# Patient Record
Sex: Female | Born: 1990 | Race: White | Hispanic: No | Marital: Married | Smoking: Never smoker
Health system: Southern US, Community
[De-identification: ages and names within clinical notes are randomized; demographics above are authoritative.]

## PROBLEM LIST (undated history)

## (undated) DIAGNOSIS — G43909 Migraine, unspecified, not intractable, without status migrainosus: Secondary | ICD-10-CM

---

## 2017-10-03 ENCOUNTER — Emergency Department (HOSPITAL_COMMUNITY)
Admission: EM | Admit: 2017-10-03 | Discharge: 2017-10-03 | Disposition: A | Discharge status: Home / Self Care | Payer: BC Managed Care – PPO | Attending: Emergency Medicine | Admitting: Emergency Medicine

## 2017-10-03 ENCOUNTER — Encounter (HOSPITAL_COMMUNITY): Payer: Self-pay | Admitting: Emergency Medicine

## 2017-10-03 DIAGNOSIS — T148XXA Other injury of unspecified body region, initial encounter: Secondary | ICD-10-CM

## 2017-10-03 DIAGNOSIS — X58XXXA Exposure to other specified factors, initial encounter: Secondary | ICD-10-CM | POA: Diagnosis not present

## 2017-10-03 DIAGNOSIS — S7011XA Contusion of right thigh, initial encounter: Secondary | ICD-10-CM | POA: Diagnosis not present

## 2017-10-03 DIAGNOSIS — Y999 Unspecified external cause status: Secondary | ICD-10-CM | POA: Insufficient documentation

## 2017-10-03 DIAGNOSIS — Y929 Unspecified place or not applicable: Secondary | ICD-10-CM | POA: Diagnosis not present

## 2017-10-03 DIAGNOSIS — S7012XA Contusion of left thigh, initial encounter: Secondary | ICD-10-CM | POA: Diagnosis not present

## 2017-10-03 DIAGNOSIS — Y939 Activity, unspecified: Secondary | ICD-10-CM | POA: Diagnosis not present

## 2017-10-03 LAB — CBC WITH DIFFERENTIAL/PLATELET
BASOS PCT: 0 %
Basophils Absolute: 0 10*3/uL (ref 0.0–0.1)
EOS ABS: 0.1 10*3/uL (ref 0.0–0.7)
Eosinophils Relative: 1 %
HEMATOCRIT: 41.1 % (ref 36.0–46.0)
HEMOGLOBIN: 13.8 g/dL (ref 12.0–15.0)
LYMPHS ABS: 2.5 10*3/uL (ref 0.7–4.0)
Lymphocytes Relative: 29 %
MCH: 31.4 pg (ref 26.0–34.0)
MCHC: 33.6 g/dL (ref 30.0–36.0)
MCV: 93.6 fL (ref 78.0–100.0)
MONO ABS: 0.7 10*3/uL (ref 0.1–1.0)
MONOS PCT: 8 %
NEUTROS PCT: 62 %
Neutro Abs: 5.3 10*3/uL (ref 1.7–7.7)
Platelets: 240 10*3/uL (ref 150–400)
RBC: 4.39 MIL/uL (ref 3.87–5.11)
RDW: 12.2 % (ref 11.5–15.5)
WBC: 8.6 10*3/uL (ref 4.0–10.5)

## 2017-10-03 LAB — COMPREHENSIVE METABOLIC PANEL
ALBUMIN: 3.6 g/dL (ref 3.5–5.0)
ALK PHOS: 74 U/L (ref 38–126)
ALT: 14 U/L (ref 14–54)
ANION GAP: 7 (ref 5–15)
AST: 16 U/L (ref 15–41)
BILIRUBIN TOTAL: 0.7 mg/dL (ref 0.3–1.2)
BUN: 12 mg/dL (ref 6–20)
CALCIUM: 9.2 mg/dL (ref 8.9–10.3)
CO2: 24 mmol/L (ref 22–32)
Chloride: 107 mmol/L (ref 101–111)
Creatinine, Ser: 0.72 mg/dL (ref 0.44–1.00)
GFR calc non Af Amer: >60 mL/min (ref >60)
Glucose, Bld: 83 mg/dL (ref 65–99)
POTASSIUM: 3.8 mmol/L (ref 3.5–5.1)
SODIUM: 138 mmol/L (ref 135–145)
TOTAL PROTEIN: 6.5 g/dL (ref 6.5–8.1)

## 2017-10-03 NOTE — ED Notes (Signed)
Pt verbalizes understanding of d/c instructions. Pt ambulatory at d/c with all belongings.   

## 2017-10-03 NOTE — ED Triage Notes (Signed)
Reports noting bruising to both legs that started around Tuesday.  Unsure of any injury.  Also endorses swelling in legs from bruising.

## 2017-10-03 NOTE — Discharge Instructions (Signed)
See your Physician for recheck next week  °

## 2017-10-04 NOTE — ED Provider Notes (Signed)
[facility] EMERGENCY [facility]PARTMENT Provider Note   CSN: [id number] Arrival date & time: 10/03/17  1845     History   Chief Complaint Chief Complaint  Patient presents with  . Bleeding/Bruising    HPI Lisa Pugh is a 27 y.o. female.  Pt complains of bruising to both outer thighs.  Pt reports no injuries.  Pt reports she noticed bruising around legs on Tuesday.  Pt reports bruising has continued to get worse.  Pt denies any new activity.  Pt has not been sick.  Pt does not have bruising on head, neck or trunk.     The history is provided by the patient. No language interpreter was used.    History reviewed. No pertinent past medical history.  There are no active problems to display for this patient.   History reviewed. No pertinent surgical history.  OB History    No data available       Home Medications    Prior to Admission medications   Not on File    Family History No family history on file.  Social History Social History   Tobacco Use  . Smoking status: Never Smoker  . Smokeless tobacco: Never Used  Substance Use Topics  . Alcohol use: Yes    Comment: rarely  . Drug use: No     Allergies   Patient has no allergy information on record.   Review of Systems Review of Systems  Constitutional: Positive for chills and fatigue. Negative for fever.  HENT: Negative for nosebleeds.   Respiratory: Negative for cough.   Neurological: Negative for light-headedness.  Hematological: Bruises/bleeds easily.  All other systems reviewed and are negative.    Physical Exam Updated Vital Signs BP 107/62 (BP Location: Left Arm)   Pulse 70   Temp 98.3 F (36.8 C) (Oral)   Resp 18   Ht 5\' 8"  (1.727 m)   Wt 77.1 kg (170 lb)   SpO2 100%   BMI 25.85 kg/m   Physical Exam  Constitutional: She appears well-developed and well-nourished. No distress.  HENT:  Head: Normocephalic and atraumatic.  Eyes: Conjunctivae are normal.    Cardiovascular: Normal rate.  No murmur heard. Pulmonary/Chest: Effort normal. No respiratory distress.  Abdominal: There is no tenderness.  Musculoskeletal: She exhibits no edema.  Neurological: She is alert.  Skin: Skin is warm and dry.  Large bruises bilat outer thighs,  Abdomen and back spared,    Psychiatric: She has a normal mood and affect.  Nursing note and vitals reviewed.    ED Treatments / Results  Labs (all labs ordered are listed, but only abnormal results are displayed) Labs Reviewed  CBC WITH DIFFERENTIAL/PLATELET  COMPREHENSIVE METABOLIC PANEL    EKG  EKG Interpretation None       Radiology No results found.  Procedures Procedures (including critical care time)  Medications Ordered in ED Medications - No data to display   Initial Impression / Assessment and Plan / ED Course  I have reviewed the triage vital signs and the nursing notes.  Pertinent labs & imaging results that were available during my care of the patient were reviewed by me and considered in my medical decision making (see chart for details).     MDM  Cbc normal platelets normal,  Renal function and liver functions normal.  Pt advised to follow up with primary care,  One area looks like a varicosity.  Pt advised to monitor for anything that could be causing bruising.  Final Clinical Impressions(s) / ED Diagnoses   Final diagnoses:  Bruising    ED Discharge Orders    None    An After Visit Summary was printed and given to the patient.    Elson Areas, PA-C 10/04/17 0010    Charlynne Pander, MD 10/06/17 [phone]

## 2018-08-05 ENCOUNTER — Emergency Department (HOSPITAL_COMMUNITY)
Admission: EM | Admit: 2018-08-05 | Discharge: 2018-08-05 | Disposition: A | Discharge status: Home / Self Care | Payer: BLUE CROSS/BLUE SHIELD | Attending: Emergency Medicine | Admitting: Emergency Medicine

## 2018-08-05 ENCOUNTER — Encounter: Payer: Self-pay | Admitting: Emergency Medicine

## 2018-08-05 DIAGNOSIS — G43909 Migraine, unspecified, not intractable, without status migrainosus: Secondary | ICD-10-CM

## 2018-08-05 HISTORY — DX: Migraine, unspecified, not intractable, without status migrainosus: G43.909

## 2018-08-05 MED ORDER — PROCHLORPERAZINE EDISYLATE 10 MG/2ML IJ SOLN
10.0000 mg | Freq: Once | INTRAMUSCULAR | Status: AC
  Administered 2018-08-05: 10 mg via INTRAVENOUS
  Filled 2018-08-05: qty 2

## 2018-08-05 MED ORDER — DIPHENHYDRAMINE HCL 50 MG/ML IJ SOLN
25.0000 mg | Freq: Once | INTRAMUSCULAR | Status: AC
  Administered 2018-08-05: 25 mg via INTRAVENOUS
  Filled 2018-08-05: qty 1

## 2018-08-05 MED ORDER — KETOROLAC TROMETHAMINE 15 MG/ML IJ SOLN
15.0000 mg | Freq: Once | INTRAMUSCULAR | Status: AC
  Administered 2018-08-05: 15 mg via INTRAVENOUS
  Filled 2018-08-05: qty 1

## 2018-08-05 MED ORDER — ONDANSETRON HCL 4 MG/2ML IJ SOLN
4.0000 mg | Freq: Once | INTRAMUSCULAR | Status: AC
  Administered 2018-08-05: 4 mg via INTRAVENOUS
  Filled 2018-08-05: qty 2

## 2018-08-05 MED ORDER — SODIUM CHLORIDE 0.9 % IV BOLUS
1000.0000 mL | Freq: Once | INTRAVENOUS | Status: AC
  Administered 2018-08-05: 1000 mL via INTRAVENOUS

## 2018-08-05 NOTE — Discharge Instructions (Addendum)
You were evaluated in the Emergency Department and after careful evaluation, we did not find any emergent condition requiring admission or further testing in the hospital.  Your symptoms today seem to be due to a migraine.  We were able to make you feel better after migraine cocktail here in the emergency department.  You seemed to experience a side effect to Compazine today.  It would benefit you to mention this to other doctors in the future.  You can use Tylenol or ibuprofen at home for pain, continue to rest today, follow-up with your normal migraine doctor for further management.  Please return to the Emergency Department if you experience any worsening of your condition.  We encourage you to follow up with a primary care provider.  Thank you for allowing [place] to be a part of your care.

## 2018-08-05 NOTE — ED Triage Notes (Signed)
Pt in c/o migraine since Sunday, history of same, takes botox injections for treatment, unable to control with home medications

## 2018-08-05 NOTE — ED Notes (Signed)
Patient verbalizes understanding of discharge instructions. Opportunity for questioning and answers were provided. Armband removed by staff, pt discharged from ED.  

## 2018-08-05 NOTE — ED Provider Notes (Signed)
[facility] Emergency Department Provider Note MRN:  [id number]  Arrival date & time: 08/05/18     Chief Complaint   Migraine   History of Present Illness   Lisa Pugh is a 28 y.o. year-old female with a history of migraine presenting to the ED with chief complaint of migraine.  Headache began 3 days ago, gradual onset, located behind the eyes bilaterally as well as the occipital region.  Slowly escalated to severe, waxed and waned the next day, felt nearly back to normal upon awakening this morning, but then it gradually returned and is now severe again.  Denies fever, no neck pain.  Pain is worse with bright lights and loud noises, patient is experiencing left eye vision blurriness, which has happened in the past with her prior migraines.  Denies chest pain or shortness of breath, no abdominal pain, no numbness weakness to the arms or legs.  Associated with nausea.  Review of Systems  A complete 10 system review of systems was obtained and all systems are negative except as noted in the HPI and PMH.   Patient's Health History    Past Medical History:  Diagnosis Date  . Migraines     History reviewed. No pertinent surgical history.  History reviewed. No pertinent family history.  Social History   Socioeconomic History  . Marital status: Significant Other    Spouse name: Not on file  . Number of children: Not on file  . Years of education: Not on file  . Highest education level: Not on file  Occupational History  . Not on file  Social Needs  . Financial resource strain: Not on file  . Food insecurity:    Worry: Not on file    Inability: Not on file  . Transportation needs:    Medical: Not on file    Non-medical: Not on file  Tobacco Use  . Smoking status: Never Smoker  . Smokeless tobacco: Never Used  Substance and Sexual Activity  . Alcohol use: Yes    Comment: rarely  . Drug use: No  . Sexual activity: Not on file  Lifestyle  . Physical  activity:    Days per week: Not on file    Minutes per session: Not on file  . Stress: Not on file  Relationships  . Social connections:    Talks on phone: Not on file    Gets together: Not on file    Attends religious service: Not on file    Active member of club or organization: Not on file    Attends meetings of clubs or organizations: Not on file    Relationship status: Not on file  . Intimate partner violence:    Fear of current or ex partner: Not on file    Emotionally abused: Not on file    Physically abused: Not on file    Forced sexual activity: Not on file  Other Topics Concern  . Not on file  Social History Narrative  . Not on file     Physical Exam  Vital Signs and Nursing Notes reviewed Vitals:   08/05/18 1152 08/05/18 1249  BP: 123/80 114/67  Pulse: 75 79  Resp: 12 20  Temp: 97.7 F (36.5 C)   SpO2: 100% 95%    CONSTITUTIONAL: Well-appearing, NAD NEURO:  Alert and oriented x 3, no focal deficits EYES:  eyes equal and reactive, normal extraocular movements, subjective decreased visual acuity to the left eye in the lateral visual  field ENT/NECK:  no LAD, no JVD CARDIO: Regular rate, well-perfused, normal S1 and S2 PULM:  CTAB no wheezing or rhonchi GI/GU:  normal bowel sounds, non-distended, non-tender MSK/SPINE:  No gross deformities, no edema SKIN:  no rash, atraumatic PSYCH:  Appropriate speech and behavior  Diagnostic and Interventional Summary    EKG Interpretation  Date/Time:  Wednesday August 05 2018 12:49:52 EST Ventricular Rate:  77 PR Interval:  138 QRS Duration: 76 QT Interval:  398 QTC Calculation: 450 R Axis:   81 Text Interpretation:  Normal sinus rhythm Normal ECG Confirmed by Kennis CarinaBero, Jasdeep Kepner [phone]([id number]) on 08/05/2018 1:09:11 PM      Labs Reviewed - No data to display  No orders to display    Medications  sodium chloride 0.9 % bolus 1,000 mL (1,000 mLs Intravenous New Bag/Given 08/05/18 1215)  ondansetron (ZOFRAN) injection 4 mg (4 mg  Intravenous Given 08/05/18 1208)  ketorolac (TORADOL) 15 MG/ML injection 15 mg (15 mg Intravenous Given 08/05/18 1210)  diphenhydrAMINE (BENADRYL) injection 25 mg (25 mg Intravenous Given 08/05/18 1215)  prochlorperazine (COMPAZINE) injection 10 mg (10 mg Intravenous Given 08/05/18 1211)  diphenhydrAMINE (BENADRYL) injection 25 mg (25 mg Intravenous Given 08/05/18 1247)     Procedures Critical Care  ED Course and Medical Decision Making  I have reviewed the triage vital signs and the nursing notes.  Pertinent labs & imaging results that were available during my care of the patient were reviewed by me and considered in my medical decision making (see below for details).   Concern for migraine in this 28 year old female with history of the same.  Would be considered complex given the subjective decreased visual acuity of the left eye, no other neurological deficits.  No meningismus.  Given that this has occurred in the past, no indication for CNS imaging at this moment.  Little to no concern for subarachnoid hemorrhage given the gradual onset and patient's history.  Will provide migraine cocktail and reassess.  Clinical Course as of Aug 05 1312  Wed Aug 05, 2018  1202 Patient explains that there is no chance that she is pregnant and defers pregnancy testing today.  Just started her period.   [MB]  1244 15 minutes after migraine cocktail patient experiencing jitteriness, appears anxious, total body unpleasant sensation, endorsing trouble breathing, denies chest pain.  Seems consistent with reaction to Compazine, will provide more Benadryl.  Lungs clear, will obtain repeat set of vital signs, EKG, reassess.   [MB]    Clinical Course User Index [MB] Sabas SousBero, Wendy Mikles M, MD    EKG unremarkable, patient's symptoms completely resolved after a few minutes and with additional dose of IV Benadryl.  Patient is now resting comfortably, headache largely resolved.  Reevaluation demonstrates resolution of her visual  disturbance, now completely normal.  All consistent with migraine, will follow-up with her normal migraine specialist.  After the discussed management above, the patient was determined to be safe for discharge.  The patient was in agreement with this plan and all questions regarding their care were answered.  ED return precautions were discussed and the patient will return to the ED with any significant worsening of condition.  Elmer SowMichael M. Pilar PlateBero, MD [facility] [email or web address]  Final Clinical Impressions(s) / ED Diagnoses     ICD-10-CM   1. Migraine without status migrainosus, not intractable, unspecified migraine type G43.909     ED Discharge Orders    None         Kennis CarinaBero, Virga Haltiwanger  M, MD 08/05/18 1315

## 2019-07-27 ENCOUNTER — Ambulatory Visit: Payer: BC Managed Care – PPO | Attending: Internal Medicine

## 2019-10-11 ENCOUNTER — Emergency Department (HOSPITAL_BASED_OUTPATIENT_CLINIC_OR_DEPARTMENT_OTHER)
Admission: EM | Admit: 2019-10-11 | Discharge: 2019-10-11 | Disposition: A | Discharge status: Home / Self Care | Payer: BC Managed Care – PPO | Attending: Emergency Medicine | Admitting: Emergency Medicine

## 2019-10-11 ENCOUNTER — Encounter (HOSPITAL_BASED_OUTPATIENT_CLINIC_OR_DEPARTMENT_OTHER): Payer: Self-pay | Admitting: *Deleted

## 2019-10-11 ENCOUNTER — Other Ambulatory Visit: Payer: Self-pay

## 2019-10-11 ENCOUNTER — Emergency Department (HOSPITAL_BASED_OUTPATIENT_CLINIC_OR_DEPARTMENT_OTHER): Payer: BC Managed Care – PPO

## 2019-10-11 DIAGNOSIS — Y929 Unspecified place or not applicable: Secondary | ICD-10-CM | POA: Insufficient documentation

## 2019-10-11 DIAGNOSIS — Y999 Unspecified external cause status: Secondary | ICD-10-CM | POA: Insufficient documentation

## 2019-10-11 DIAGNOSIS — W08XXXA Fall from other furniture, initial encounter: Secondary | ICD-10-CM | POA: Insufficient documentation

## 2019-10-11 DIAGNOSIS — S060X0A Concussion without loss of consciousness, initial encounter: Secondary | ICD-10-CM | POA: Insufficient documentation

## 2019-10-11 DIAGNOSIS — Y9341 Activity, dancing: Secondary | ICD-10-CM | POA: Insufficient documentation

## 2019-10-11 DIAGNOSIS — S0990XA Unspecified injury of head, initial encounter: Secondary | ICD-10-CM | POA: Diagnosis present

## 2019-10-11 MED ORDER — ONDANSETRON HCL 4 MG/2ML IJ SOLN
4.0000 mg | Freq: Once | INTRAMUSCULAR | Status: AC
  Administered 2019-10-11: 21:00:00 4 mg via INTRAVENOUS
  Filled 2019-10-11: qty 2

## 2019-10-11 MED ORDER — KETOROLAC TROMETHAMINE 15 MG/ML IJ SOLN
15.0000 mg | Freq: Once | INTRAMUSCULAR | Status: AC
  Administered 2019-10-11: 21:00:00 15 mg via INTRAVENOUS
  Filled 2019-10-11: qty 1

## 2019-10-11 MED ORDER — ONDANSETRON 4 MG PO TBDP: 4.0000 mg | ORAL_TABLET | Freq: Three times a day (TID) | ORAL | 0 refills | Status: DC | PRN

## 2019-10-11 MED ORDER — SODIUM CHLORIDE 0.9 % IV BOLUS
1000.0000 mL | Freq: Once | INTRAVENOUS | Status: AC
  Administered 2019-10-11: 21:00:00 1000 mL via INTRAVENOUS

## 2019-10-11 NOTE — Discharge Instructions (Signed)
Tomorrow morning, please call your neurology office to get a close follow-up appointment for either tomorrow or on Wednesday.  Recommend taking the Zofran as needed for nausea.  Recommend taking Motrin or naproxen as well as Tylenol for pain control.  If you develop worsening vomiting, severe headache, vision changes, passing out or other new concerning symptom, return to ER for reassessment.

## 2019-10-11 NOTE — ED Triage Notes (Signed)
Pt reports that she was in [place] this past weekend. States that she fell while at a bar, hitting her posterior head. Reports that she flew home yesterday and is dizzy, nauseated and does not remember flying home. Pt reports posterior neck pain, c-collar applied in triage. ambulatory

## 2019-10-11 NOTE — ED Provider Notes (Signed)
[facility] EMERGENCY DEPARTMENT Provider Note   CSN: [id number] Arrival date & time: 10/11/19  1934     History Chief Complaint  Patient presents with  . Fall    Lisa Pugh is a 29 y.o. female.  Presents emergency department for follow-up after a fall.  Patient was on gross week in [place], dancing on top of a table while intoxicated Friday when she fell, landing on her head.  Initially thought she was okay however the past couple days has been having severe headache, intermittent nausea.  Headache up to 10 out of 10 in severity.  Dull, achy.  Throbbing.  Has had difficulty recalling recent events, short-term memory.  Pain neck dull, achy.  No associated numbness or weakness in her arms or legs.  Denies other associated injuries.  HPI     Past Medical History:  Diagnosis Date  . Migraines     There are no problems to display for this patient.   History reviewed. No pertinent surgical history.   OB History   No obstetric history on file.     History reviewed. No pertinent family history.  Social History   Tobacco Use  . Smoking status: Never Smoker  . Smokeless tobacco: Never Used  Substance Use Topics  . Alcohol use: Yes    Comment: rarely  . Drug use: No    Home Medications Prior to Admission medications   Medication Sig Start Date End Date Taking? Authorizing Provider  ondansetron (ZOFRAN ODT) 4 MG disintegrating tablet Take 1 tablet (4 mg total) by mouth every 8 (eight) hours as needed for nausea or vomiting. 10/11/19   Milagros Loll, MD    Allergies    Compazine [prochlorperazine]  Review of Systems   Review of Systems  Constitutional: Negative for chills and fever.  HENT: Negative for ear pain and sore throat.   Eyes: Negative for pain and visual disturbance.  Respiratory: Negative for cough and shortness of breath.   Cardiovascular: Negative for chest pain and palpitations.  Gastrointestinal: Positive for nausea. Negative for  abdominal pain and vomiting.  Genitourinary: Negative for dysuria and hematuria.  Musculoskeletal: Negative for arthralgias and back pain.  Skin: Negative for color change and rash.  Neurological: Positive for headaches. Negative for seizures and syncope.  All other systems reviewed and are negative.   Physical Exam Updated Vital Signs BP (!) 97/58 (BP Location: Left Arm)   Pulse 68   Temp (!) 97.2 F (36.2 C)   Resp 16   Ht 5\' 8"  (1.727 m)   Wt 72.6 kg   LMP 10/08/2019   SpO2 99%   BMI 24.33 kg/m   Physical Exam Vitals and nursing note reviewed.  Constitutional:      General: She is not in acute distress.    Appearance: She is well-developed.  HENT:     Head: Normocephalic and atraumatic.  Eyes:     Conjunctiva/sclera: Conjunctivae normal.  Neck:     Comments: No focal bony tenderness Cardiovascular:     Rate and Rhythm: Normal rate and regular rhythm.     Heart sounds: No murmur.  Pulmonary:     Effort: Pulmonary effort is normal. No respiratory distress.     Breath sounds: Normal breath sounds.  Abdominal:     Palpations: Abdomen is soft.     Tenderness: There is no abdominal tenderness.  Musculoskeletal:     Cervical back: Normal range of motion and neck supple. No rigidity.  Skin:  General: Skin is warm and dry.     Capillary Refill: Capillary refill takes less than 2 seconds.  Neurological:     General: No focal deficit present.     Mental Status: She is alert and oriented to person, place, and time.     Motor: No weakness.     Coordination: Coordination normal.     Gait: Gait normal.  Psychiatric:        Mood and Affect: Mood normal.     ED Results / Procedures / Treatments   Labs (all labs ordered are listed, but only abnormal results are displayed) Labs Reviewed - No data to display  EKG None  Radiology CT Head Wo Contrast  Result Date: 10/11/2019 CLINICAL DATA:  Head trauma headache EXAM: CT HEAD WITHOUT CONTRAST TECHNIQUE: Contiguous  axial images were obtained from the base of the skull through the vertex without intravenous contrast. COMPARISON:  None. FINDINGS: Brain: No acute territorial infarction, hemorrhage or intracranial mass. The ventricles are nonenlarged. Vascular: No hyperdense vessel or unexpected calcification. Skull: Normal. Negative for fracture or focal lesion. Sinuses/Orbits: No acute finding. Other: None IMPRESSION: Negative non contrasted CT appearance of the brain Electronically Signed   By: Jasmine Pang M.D.   On: 10/11/2019 20:14   CT Cervical Spine Wo Contrast  Result Date: 10/11/2019 CLINICAL DATA:  Head trauma headache EXAM: CT CERVICAL SPINE WITHOUT CONTRAST TECHNIQUE: Multidetector CT imaging of the cervical spine was performed without intravenous contrast. Multiplanar CT image reconstructions were also generated. COMPARISON:  None. FINDINGS: Alignment: Normal. Skull base and vertebrae: No acute fracture. No primary bone lesion or focal pathologic process. Soft tissues and spinal canal: No prevertebral fluid or swelling. No visible canal hematoma. Disc levels:  Within normal limits Upper chest: Negative. Other: None IMPRESSION: Negative.  No CT evidence for acute osseous abnormality. Electronically Signed   By: Jasmine Pang M.D.   On: 10/11/2019 20:16    Procedures Procedures (including critical care time)  Medications Ordered in ED Medications  ondansetron (ZOFRAN) injection 4 mg (4 mg Intravenous Given 10/11/19 2120)  ketorolac (TORADOL) 15 MG/ML injection 15 mg (15 mg Intravenous Given 10/11/19 2120)  sodium chloride 0.9 % bolus 1,000 mL (0 mLs Intravenous Stopped 10/11/19 2226)    ED Course  I have reviewed the triage vital signs and the nursing notes.  Pertinent labs & imaging results that were available during my care of the patient were reviewed by me and considered in my medical decision making (see chart for details).    MDM Rules/Calculators/A&P                      29 year old lady  presenting to ER with severe headache, nausea and neck pain in setting of recent fall and head injury.  CT head and C-spine were negative.  Suspect patient suffered concussion.  Provided with headache cocktail, significant improvement in her symptoms.  Established patient with [facility] neurology for migraines.  For follow-up on this suspected concussion, recommended that she call her neurologist tomorrow morning to get close follow-up appointment to be seen tomorrow or on Wednesday.  Patient agreeable.  Will discharge home.    After the discussed management above, the patient was determined to be safe for discharge.  The patient was in agreement with this plan and all questions regarding their care were answered.  ED return precautions were discussed and the patient will return to the ED with any significant worsening of condition.   Final  Clinical Impression(s) / ED Diagnoses Final diagnoses:  Concussion without loss of consciousness, initial encounter    Rx / DC Orders ED Discharge Orders         Ordered    ondansetron (ZOFRAN ODT) 4 MG disintegrating tablet  Every 8 hours PRN     10/11/19 2216           Lucrezia Starch, MD 10/12/19 0010

## 2019-11-16 ENCOUNTER — Emergency Department (HOSPITAL_BASED_OUTPATIENT_CLINIC_OR_DEPARTMENT_OTHER)
Admission: EM | Admit: 2019-11-16 | Discharge: 2019-11-16 | Disposition: A | Discharge status: Home / Self Care | Payer: BC Managed Care – PPO | Attending: Emergency Medicine | Admitting: Emergency Medicine

## 2019-11-16 ENCOUNTER — Other Ambulatory Visit: Payer: Self-pay

## 2019-11-16 ENCOUNTER — Encounter (HOSPITAL_BASED_OUTPATIENT_CLINIC_OR_DEPARTMENT_OTHER): Payer: Self-pay | Admitting: *Deleted

## 2019-11-16 DIAGNOSIS — E876 Hypokalemia: Secondary | ICD-10-CM | POA: Diagnosis not present

## 2019-11-16 DIAGNOSIS — R197 Diarrhea, unspecified: Secondary | ICD-10-CM | POA: Insufficient documentation

## 2019-11-16 DIAGNOSIS — Z888 Allergy status to other drugs, medicaments and biological substances status: Secondary | ICD-10-CM | POA: Insufficient documentation

## 2019-11-16 DIAGNOSIS — R112 Nausea with vomiting, unspecified: Secondary | ICD-10-CM | POA: Diagnosis present

## 2019-11-16 LAB — URINALYSIS, ROUTINE W REFLEX MICROSCOPIC
Bilirubin Urine: NEGATIVE
Glucose, UA: NEGATIVE mg/dL
Hgb urine dipstick: NEGATIVE
Ketones, ur: NEGATIVE mg/dL
Leukocytes,Ua: NEGATIVE
Nitrite: NEGATIVE
Protein, ur: NEGATIVE mg/dL
Specific Gravity, Urine: 1.015 (ref 1.005–1.030)
pH: 6 (ref 5.0–8.0)

## 2019-11-16 LAB — COMPREHENSIVE METABOLIC PANEL
ALT: 16 U/L (ref 0–44)
AST: 18 U/L (ref 15–41)
Albumin: 3.7 g/dL (ref 3.5–5.0)
Alkaline Phosphatase: 75 U/L (ref 38–126)
Anion gap: 11 (ref 5–15)
BUN: 12 mg/dL (ref 6–20)
CO2: 25 mmol/L (ref 22–32)
Calcium: 9.2 mg/dL (ref 8.9–10.3)
Chloride: 101 mmol/L (ref 98–111)
Creatinine, Ser: 0.94 mg/dL (ref 0.44–1.00)
GFR calc Af Amer: >60 mL/min (ref >60)
GFR calc non Af Amer: >60 mL/min (ref >60)
Glucose, Bld: 97 mg/dL (ref 70–99)
Potassium: 2.9 mmol/L — ABNORMAL LOW (ref 3.5–5.1)
Sodium: 137 mmol/L (ref 135–145)
Total Bilirubin: 0.5 mg/dL (ref 0.3–1.2)
Total Protein: 7.3 g/dL (ref 6.5–8.1)

## 2019-11-16 LAB — CBC
HCT: 48.2 % — ABNORMAL HIGH (ref 36.0–46.0)
Hemoglobin: 16.8 g/dL — ABNORMAL HIGH (ref 12.0–15.0)
MCH: 32.7 pg (ref 26.0–34.0)
MCHC: 34.9 g/dL (ref 30.0–36.0)
MCV: 94 fL (ref 80.0–100.0)
Platelets: 234 10*3/uL (ref 150–400)
RBC: 5.13 MIL/uL — ABNORMAL HIGH (ref 3.87–5.11)
RDW: 12 % (ref 11.5–15.5)
WBC: 6.9 10*3/uL (ref 4.0–10.5)
nRBC: 0 % (ref 0.0–0.2)

## 2019-11-16 LAB — LIPASE, BLOOD: Lipase: 29 U/L (ref 11–51)

## 2019-11-16 LAB — PREGNANCY, URINE: Preg Test, Ur: NEGATIVE

## 2019-11-16 MED ORDER — ONDANSETRON 4 MG PO TBDP: 4.0000 mg | ORAL_TABLET | Freq: Three times a day (TID) | ORAL | 0 refills | Status: AC | PRN

## 2019-11-16 MED ORDER — POTASSIUM CHLORIDE CRYS ER 20 MEQ PO TBCR
40.0000 meq | EXTENDED_RELEASE_TABLET | Freq: Once | ORAL | Status: AC
  Administered 2019-11-16: 40 meq via ORAL
  Filled 2019-11-16: qty 2

## 2019-11-16 MED ORDER — ONDANSETRON HCL 4 MG/2ML IJ SOLN
4.0000 mg | Freq: Once | INTRAMUSCULAR | Status: AC
  Administered 2019-11-16: 4 mg via INTRAVENOUS
  Filled 2019-11-16: qty 2

## 2019-11-16 MED ORDER — LACTATED RINGERS IV BOLUS
1000.0000 mL | Freq: Once | INTRAVENOUS | Status: AC
  Administered 2019-11-16: 1000 mL via INTRAVENOUS

## 2019-11-16 MED ORDER — LORAZEPAM 2 MG/ML IJ SOLN
1.0000 mg | Freq: Once | INTRAMUSCULAR | Status: AC
  Administered 2019-11-16: 1 mg via INTRAVENOUS
  Filled 2019-11-16: qty 1

## 2019-11-16 MED ORDER — SODIUM CHLORIDE 0.9% FLUSH
3.0000 mL | Freq: Once | INTRAVENOUS | Status: DC
  Filled 2019-11-16: qty 3

## 2019-11-16 MED ORDER — DROPERIDOL 2.5 MG/ML IJ SOLN
2.5000 mg | Freq: Once | INTRAMUSCULAR | Status: AC
  Administered 2019-11-16: 20:00:00 2.5 mg via INTRAVENOUS
  Filled 2019-11-16: qty 2

## 2019-11-16 NOTE — ED Provider Notes (Signed)
[facility] EMERGENCY DEPARTMENT Provider Note   CSN: [id number] Arrival date & time: 11/16/19  1701     History Chief Complaint  Patient presents with  . Emesis    Lisa Pugh is a 29 y.o. female.  29 year old female with history of migraines who presents with vomiting and diarrhea.  2 days ago, the patient began having significant vomiting associated with nonbloody diarrhea.  Her vomiting has improved, last episode was yesterday, but she continues to be nauseated.  No improvement with Phenergan and Dramamine at home.  She continues to have diarrhea.  She broke out in a sweat on the first night of her symptoms but did not measure her temperature.  She reports generalized crampy abdominal pain.  No cough, sore throat, runny nose, urinary symptoms, sick contacts, or recent travel.  The history is provided by the patient.  Emesis      Past Medical History:  Diagnosis Date  . Migraines     There are no problems to display for this patient.   History reviewed. No pertinent surgical history.   OB History   No obstetric history on file.     History reviewed. No pertinent family history.  Social History   Tobacco Use  . Smoking status: Never Smoker  . Smokeless tobacco: Never Used  Substance Use Topics  . Alcohol use: Yes    Comment: rarely  . Drug use: No    Home Medications Prior to Admission medications   Medication Sig Start Date End Date Taking? Authorizing Provider  ondansetron (ZOFRAN ODT) 4 MG disintegrating tablet Take 1 tablet (4 mg total) by mouth every 8 (eight) hours as needed for nausea or vomiting. 11/16/19   Shataya Winkles, Ambrose Finland, MD    Allergies    Compazine [prochlorperazine]  Review of Systems   Review of Systems  Gastrointestinal: Positive for vomiting.   All other systems reviewed and are negative except that which was mentioned in HPI  Physical Exam Updated Vital Signs BP 124/79   Pulse (!) 118   Temp 97.9 F (36.6 C)  (Oral)   Resp 17   Ht 5\' 8"  (1.727 m)   Wt 67.1 kg   LMP 11/02/2019   SpO2 100%   BMI 22.50 kg/m   Physical Exam Vitals and nursing note reviewed.  Constitutional:      General: She is not in acute distress.    Appearance: She is well-developed.  HENT:     Head: Normocephalic and atraumatic.  Eyes:     Conjunctiva/sclera: Conjunctivae normal.  Cardiovascular:     Rate and Rhythm: Normal rate and regular rhythm.     Heart sounds: Normal heart sounds. No murmur.  Pulmonary:     Effort: Pulmonary effort is normal.     Breath sounds: Normal breath sounds.  Abdominal:     General: Bowel sounds are normal. There is no distension.     Palpations: Abdomen is soft.     Tenderness: There is abdominal tenderness. There is no guarding or rebound.     Comments: Mild generalized abdominal tenderness without peritonitis  Musculoskeletal:     Cervical back: Neck supple.  Skin:    General: Skin is warm and dry.  Neurological:     Mental Status: She is alert and oriented to person, place, and time.     Comments: Fluent speech  Psychiatric:        Judgment: Judgment normal.     ED Results / Procedures / Treatments  Labs (all labs ordered are listed, but only abnormal results are displayed) Labs Reviewed  COMPREHENSIVE METABOLIC PANEL - Abnormal; Notable for the following components:      Result Value   Potassium 2.9 (*)    All other components within normal limits  CBC - Abnormal; Notable for the following components:   RBC 5.13 (*)    Hemoglobin 16.8 (*)    HCT 48.2 (*)    All other components within normal limits  LIPASE, BLOOD  URINALYSIS, ROUTINE W REFLEX MICROSCOPIC  PREGNANCY, URINE    EKG None  Radiology No results found.  Procedures Procedures (including critical care time)  Medications Ordered in ED Medications  lactated ringers bolus 1,000 mL (0 mLs Intravenous Stopped 11/16/19 2014)  ondansetron (ZOFRAN) injection 4 mg (4 mg Intravenous Given 11/16/19  1912)  potassium chloride SA (KLOR-CON) CR tablet 40 mEq (40 mEq Oral Given 11/16/19 1937)  droperidol (INAPSINE) 2.5 MG/ML injection 2.5 mg (2.5 mg Intravenous Given 11/16/19 2029)  LORazepam (ATIVAN) injection 1 mg (1 mg Intravenous Given 11/16/19 2055)    ED Course  I have reviewed the triage vital signs and the nursing notes.  Pertinent labs that were available during my care of the patient were reviewed by me and considered in my medical decision making (see chart for details).    MDM Rules/Calculators/A&P                      Nontoxic on exam, afebrile, reassuring vital signs.  Mild generalized abdominal tenderness and cramping.  Lab work shows reassuring CBC, lipase, and LFTs with normal creatinine.  Potassium is 2.9 which is not surprising given her symptoms.  Ordered IV fluid bolus, Zofran, and potassium. Later gave droperidol for nausea; pt became anxious and felt worse, gave ativan. On reassessment she was feeling better. No vomiting in the ED.  Discussed supportive measures including continued hydration with electrolyte-containing solution at home, Zofran as needed, and probiotics with slow advancement of diet.  Reviewed return precautions including any intractable vomiting or worsening abdominal pain.  She voiced understanding. Final Clinical Impression(s) / ED Diagnoses Final diagnoses:  Nausea vomiting and diarrhea  Hypokalemia    Rx / DC Orders ED Discharge Orders         Ordered    ondansetron (ZOFRAN ODT) 4 MG disintegrating tablet  Every 8 hours PRN     11/16/19 2152           Leeandra Ellerson, Wenda Overland, MD 11/16/19 [phone]

## 2019-11-16 NOTE — ED Triage Notes (Signed)
N/V/D x 2-3 days. Generalized abdominal pain and HA.

## 2019-11-16 NOTE — ED Notes (Signed)
Sprite and crackers given  

## 2019-11-16 NOTE — ED Notes (Signed)
ED Provider at bedside. 

## 2021-05-01 ENCOUNTER — Other Ambulatory Visit: Payer: Self-pay | Admitting: Neurology

## 2021-05-01 DIAGNOSIS — R519 Headache, unspecified: Secondary | ICD-10-CM

## 2021-05-03 ENCOUNTER — Other Ambulatory Visit: Payer: Self-pay

## 2021-05-03 ENCOUNTER — Ambulatory Visit
Admission: RE | Admit: 2021-05-03 | Discharge: 2021-05-03 | Disposition: A | Discharge status: Home / Self Care | Payer: BC Managed Care – PPO | Source: Ambulatory Visit | Attending: Neurology | Admitting: Neurology

## 2021-05-03 DIAGNOSIS — R519 Headache, unspecified: Secondary | ICD-10-CM

## 2021-11-18 ENCOUNTER — Other Ambulatory Visit: Payer: Self-pay

## 2021-11-18 ENCOUNTER — Encounter (HOSPITAL_BASED_OUTPATIENT_CLINIC_OR_DEPARTMENT_OTHER): Payer: Self-pay | Admitting: Emergency Medicine

## 2021-11-18 ENCOUNTER — Emergency Department (HOSPITAL_BASED_OUTPATIENT_CLINIC_OR_DEPARTMENT_OTHER)
Admission: EM | Admit: 2021-11-18 | Discharge: 2021-11-18 | Disposition: A | Discharge status: Home / Self Care | Payer: BC Managed Care – PPO | Attending: Emergency Medicine | Admitting: Emergency Medicine

## 2021-11-18 DIAGNOSIS — R519 Headache, unspecified: Secondary | ICD-10-CM | POA: Diagnosis present

## 2021-11-18 DIAGNOSIS — G43909 Migraine, unspecified, not intractable, without status migrainosus: Secondary | ICD-10-CM | POA: Insufficient documentation

## 2021-11-18 LAB — CBC WITH DIFFERENTIAL/PLATELET
Abs Immature Granulocytes: 0.03 10*3/uL (ref 0.00–0.07)
Basophils Absolute: 0 10*3/uL (ref 0.0–0.1)
Basophils Relative: 0 %
Eosinophils Absolute: 0.2 10*3/uL (ref 0.0–0.5)
Eosinophils Relative: 2 %
HCT: 47.5 % — ABNORMAL HIGH (ref 36.0–46.0)
Hemoglobin: 16.2 g/dL — ABNORMAL HIGH (ref 12.0–15.0)
Immature Granulocytes: 0 %
Lymphocytes Relative: 14 %
Lymphs Abs: 1.1 10*3/uL (ref 0.7–4.0)
MCH: 32.1 pg (ref 26.0–34.0)
MCHC: 34.1 g/dL (ref 30.0–36.0)
MCV: 94.1 fL (ref 80.0–100.0)
Monocytes Absolute: 0.6 10*3/uL (ref 0.1–1.0)
Monocytes Relative: 7 %
Neutro Abs: 5.9 10*3/uL (ref 1.7–7.7)
Neutrophils Relative %: 77 %
Platelets: 281 10*3/uL (ref 150–400)
RBC: 5.05 MIL/uL (ref 3.87–5.11)
RDW: 12.2 % (ref 11.5–15.5)
WBC: 7.8 10*3/uL (ref 4.0–10.5)
nRBC: 0 % (ref 0.0–0.2)

## 2021-11-18 LAB — BASIC METABOLIC PANEL
Anion gap: 8 (ref 5–15)
BUN: 13 mg/dL (ref 6–20)
CO2: 26 mmol/L (ref 22–32)
Calcium: 9.1 mg/dL (ref 8.9–10.3)
Chloride: 104 mmol/L (ref 98–111)
Creatinine, Ser: 0.76 mg/dL (ref 0.44–1.00)
GFR, Estimated: >60 mL/min (ref >60)
Glucose, Bld: 76 mg/dL (ref 70–99)
Potassium: 4.1 mmol/L (ref 3.5–5.1)
Sodium: 138 mmol/L (ref 135–145)

## 2021-11-18 MED ORDER — KETOROLAC TROMETHAMINE 15 MG/ML IJ SOLN
15.0000 mg | Freq: Once | INTRAMUSCULAR | Status: AC
  Administered 2021-11-18: 15 mg via INTRAVENOUS
  Filled 2021-11-18: qty 1

## 2021-11-18 MED ORDER — DEXAMETHASONE SODIUM PHOSPHATE 10 MG/ML IJ SOLN
10.0000 mg | Freq: Once | INTRAMUSCULAR | Status: AC
  Administered 2021-11-18: 10 mg via INTRAVENOUS
  Filled 2021-11-18: qty 1

## 2021-11-18 MED ORDER — METOCLOPRAMIDE HCL 5 MG/ML IJ SOLN
10.0000 mg | Freq: Once | INTRAMUSCULAR | Status: AC
  Administered 2021-11-18: 10 mg via INTRAVENOUS
  Filled 2021-11-18: qty 2

## 2021-11-18 MED ORDER — ONDANSETRON HCL 4 MG/2ML IJ SOLN
4.0000 mg | Freq: Once | INTRAMUSCULAR | Status: AC
  Administered 2021-11-18: 4 mg via INTRAVENOUS
  Filled 2021-11-18: qty 2

## 2021-11-18 MED ORDER — DIPHENHYDRAMINE HCL 50 MG/ML IJ SOLN
25.0000 mg | Freq: Once | INTRAMUSCULAR | Status: AC
  Administered 2021-11-18: 25 mg via INTRAVENOUS
  Filled 2021-11-18: qty 1

## 2021-11-18 MED ORDER — SODIUM CHLORIDE 0.9 % IV BOLUS
1000.0000 mL | Freq: Once | INTRAVENOUS | Status: AC
  Administered 2021-11-18: 1000 mL via INTRAVENOUS

## 2021-11-18 NOTE — ED Provider Notes (Signed)
?[facility] EMERGENCY DEPARTMENT ?Provider Note ? ? ?CSN: [id number] ?Arrival date & time: 11/18/21  1103 ? ?  ? ?History ? ?Chief Complaint  ?Patient presents with  ? Headache  ? ? ?Lisa Pugh is a 31 y.o. female. ? ?Patient with a past medical history of migraine presenting to the ED with chief complaint of migraine. She states that same has been ongoing for the past 10 days, went to her neurologist originally on 4/14 for same and received stadol and phenergan. States that this did give her relief but only for about 2 hours before her headache returned. She states that it originally was gradual in onset starting in her frontal region and radiating to her occipital region and into her jaw. States that this is consistent with her baseline migraine. She states that over the past 2 days it has been worsening and is now severe. She denies any fevers or neck pain. She does endorse photophobia and diplopia also consistent with her prior migraine symptoms. Some nausea as well without vomiting. Denies chest pain or shortness of breath, no abdominal pain, no numbness weakness to the arms or legs. ? ?The history is provided by the patient. No language interpreter was used.  ?Headache ?Associated symptoms: nausea   ?Associated symptoms: no abdominal pain, no diarrhea, no dizziness, no fever, no numbness, no seizures, no vomiting and no weakness   ? ?  ? ?Home Medications ?Prior to Admission medications   ?Medication Sig Start Date End Date Taking? Authorizing Provider  ?ondansetron (ZOFRAN ODT) 4 MG disintegrating tablet Take 1 tablet (4 mg total) by mouth every 8 (eight) hours as needed for nausea or vomiting. 11/16/19   Little, Ambrose Finland, MD  ?   ? ?Allergies    ?Compazine [prochlorperazine] and Droperidol   ? ?Review of Systems   ?Review of Systems  ?Constitutional:  Negative for chills and fever.  ?Gastrointestinal:  Positive for nausea. Negative for abdominal pain, diarrhea and vomiting.  ?Neurological:   Positive for headaches. Negative for dizziness, tremors, seizures, syncope, facial asymmetry, speech difficulty, weakness, light-headedness and numbness.  ?Psychiatric/Behavioral:  Negative for confusion and decreased concentration.   ?All other systems reviewed and are negative. ? ?Physical Exam ?Updated Vital Signs ?BP 111/62   Pulse 62   Temp 98.7 ?F (37.1 ?C) (Oral)   Resp 16   Ht 5\' 8"  (1.727 m)   Wt 81.6 kg   LMP 10/28/2021 Comment: Breast feeding  SpO2 99%   BMI 27.37 kg/m?  ?Physical Exam ?Vitals and nursing note reviewed.  ?Constitutional:   ?   General: She is not in acute distress. ?   Appearance: Normal appearance. She is normal weight. She is not ill-appearing, toxic-appearing or diaphoretic.  ?HENT:  ?   Head: Normocephalic and atraumatic.  ?Neck:  ?   Meningeal: Brudzinski's sign and Kernig's sign absent.  ?   Comments: No meningismus ?Cardiovascular:  ?   Rate and Rhythm: Normal rate.  ?Pulmonary:  ?   Effort: Pulmonary effort is normal. No respiratory distress.  ?Musculoskeletal:     ?   General: Normal range of motion.  ?   Cervical back: Normal range of motion and neck supple.  ?Skin: ?   General: Skin is warm and dry.  ?Neurological:  ?   General: No focal deficit present.  ?   Mental Status: She is alert and oriented to person, place, and time.  ?   GCS: GCS eye subscore is 4. GCS verbal subscore  is 5. GCS motor subscore is 6.  ?   Sensory: Sensation is intact.  ?   Motor: Motor function is intact.  ?   Coordination: Coordination is intact.  ?   Gait: Gait is intact.  ?   Comments: Alert and oriented to self, place, time and event.  ?  ?Speech is fluent, clear without dysarthria or dysphasia.  ?  ?Strength 5/5 in upper/lower extremities   ?Sensation intact in upper/lower extremities  ?  ?CN I not tested  ?CN II grossly intact visual fields bilaterally. Did not visualize posterior eye.  ?CN III, IV, VI PERRLA and EOMs intact bilaterally  ?CN V Intact sensation to sharp and light touch to  the face  ?CN VII facial movements symmetric  ?CN VIII not tested  ?CN IX, X no uvula deviation, symmetric rise of soft palate  ?CN XI 5/5 SCM and trapezius strength bilaterally  ?CN XII Midline tongue protrusion, symmetric L/R movements   ?Psychiatric:     ?   Mood and Affect: Mood normal.     ?   Behavior: Behavior normal.  ? ? ?ED Results / Procedures / Treatments   ?Labs ?(all labs ordered are listed, but only abnormal results are displayed) ?Labs Reviewed  ?CBC WITH DIFFERENTIAL/PLATELET - Abnormal; Notable for the following components:  ?    Result Value  ? Hemoglobin 16.2 (*)   ? HCT 47.5 (*)   ? All other components within normal limits  ?BASIC METABOLIC PANEL  ? ? ?EKG ?None ? ?Radiology ?No results found. ? ?Procedures ?Procedures  ? ? ?Medications Ordered in ED ?Medications  ?metoCLOPramide (REGLAN) injection 10 mg (10 mg Intravenous Given 11/18/21 1338)  ?diphenhydrAMINE (BENADRYL) injection 25 mg (25 mg Intravenous Given 11/18/21 1331)  ?ketorolac (TORADOL) 15 MG/ML injection 15 mg (15 mg Intravenous Given 11/18/21 1333)  ?dexamethasone (DECADRON) injection 10 mg (10 mg Intravenous Given 11/18/21 1335)  ?ondansetron (ZOFRAN) injection 4 mg (4 mg Intravenous Given 11/18/21 1328)  ?sodium chloride 0.9 % bolus 1,000 mL (0 mLs Intravenous Stopped 11/18/21 1436)  ? ? ?ED Course/ Medical Decision Making/ A&P ?  ?                        ?Medical Decision Making ?Amount and/or Complexity of Data Reviewed ?Labs: ordered. ? ?Risk ?Prescription drug management. ? ? ?Mathews Argyle presents with headache ?Given the large differential diagnosis for Mathews Argyle, the decision making in this case is of high complexity. ? ?After evaluating all of the data points in this case, the presentation of Lisa Pugh is NOT consistent with skull fracture, meningitis/encephalitis, SAH/sentinel bleed, Intracranial Hemorrhage (ICH) (subdural/epidural), acute obstructive hydrocephalus, space occupying lesions, CVA, CO Poisoning,  Basilar/vertebral artery dissection, preeclampsia, cerebral venous thrombosis, hypertensive emergency, temporal Arteritis, Idiopathic Intracranial Hypertension (pseudotumor cerebri). ? ?Patient presents today with headache consistent with her baseline migraines which she has an extensive history of.  She is afebrile, nontoxic-appearing, and in no acute distress with reassuring vital signs.  She is also alert and oriented and neurologically intact.  After headache cocktail of Reglan, Benadryl, Zofran, Toradol, Decadron, and fluids patient states she is feeling much better and her headache is completely resolved.  No further emergent concerns at this time.  Patient has a an appointment later this week to follow-up with her neurologist for further evaluation and management of her headaches.  She is stable for discharge, educated on red flag symptoms of prompt immediate return.  Discharged in stable condition. ? ?Strict return and follow-up precautions have been given by me personally or by detailed written instructions verbalized by nursing staff using the teach back method to patient/family/caregiver. ? ?Data Reviewed/Counseling: I have reviewed the patient's vital signs, nursing notes, and other relevant tests/information. I had a detailed discussion regarding the historical points, exam findings, and any diagnostic results supporting the discharge diagnosis. I also discussed the need for outpatient follow-up and the need to return to the ED if symptoms worsen or if there are any questions or concerns that arise at hom ? ? ?Final Clinical Impression(s) / ED Diagnoses ?Final diagnoses:  ?Migraine without status migrainosus, not intractable, unspecified migraine type  ? ? ?Rx / DC Orders ?ED Discharge Orders   ? ? None  ? ?  ?An After Visit Summary was printed and given to the patient. ? ? ?  ?Silva BandySmoot, Sye Schroepfer A, PA-C ?11/18/21 1605 ? ?  ?Cheryll CockayneHong, Joshua S, MD ?11/23/21 1355 ? ?

## 2021-11-18 NOTE — ED Notes (Signed)
Presents with HA, onset approx 10 days ago, progressively, has hx of having a lot of HA, Has tried OTC without relief.  ?

## 2021-11-18 NOTE — ED Notes (Signed)
BEFAST / VAN negative 

## 2021-11-18 NOTE — Discharge Instructions (Signed)
Follow-up with your neurologist at your appointment later this week for further evaluation and management of your headaches.  ? ?Return if development of any new or worsening symptoms. ?

## 2021-11-18 NOTE — ED Triage Notes (Signed)
Pt arrives pov, slow gait, c/o HA with nausea x 2 weeks, hx of migraines. Injection x 1.5 weeks pta, little improvement with pain. Photosensitivity, wearing sunglasses ?

## 2021-11-18 NOTE — ED Notes (Signed)
States she feels much better after IVF and medications ?

## 2021-11-18 NOTE — ED Notes (Signed)
Pain begins at occipital area and radiates to frontal lobe, down to jaw. Having photophobia. Often will have double vision. Having some nausea, no vomiting. ?

## 2021-12-10 IMAGING — CT CT HEAD W/O CM
3 series · 15 of 47 positions shown, 18 images · non-contrast
Comparison: None.

CLINICAL DATA: Head trauma headache

EXAM:
CT HEAD WITHOUT CONTRAST
TECHNIQUE: Contiguous axial images were obtained from the base of the skull
through the vertex without intravenous contrast.

[Series 2: head 5.0 h30s · axial · 0.42mm/px · z∈[-24,+101]mm · 9 of 30 slices shown, 12 images]
[im 3/30  brain]
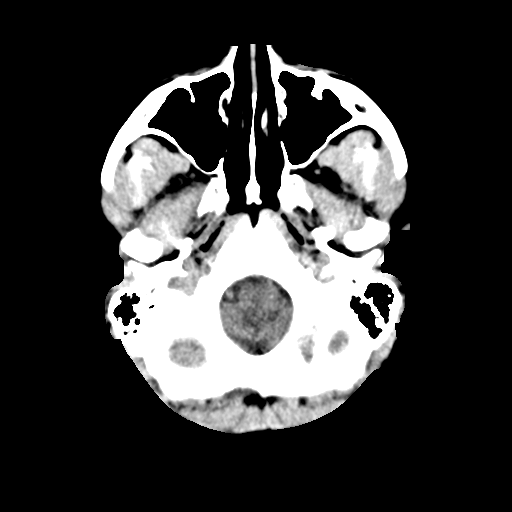
[im 3/30  bone]
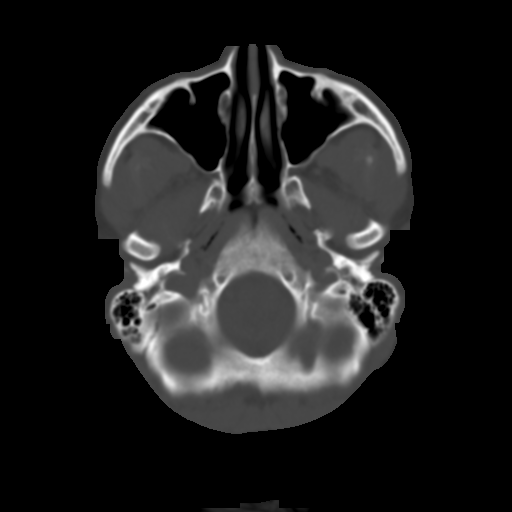
[im 6/30  brain]
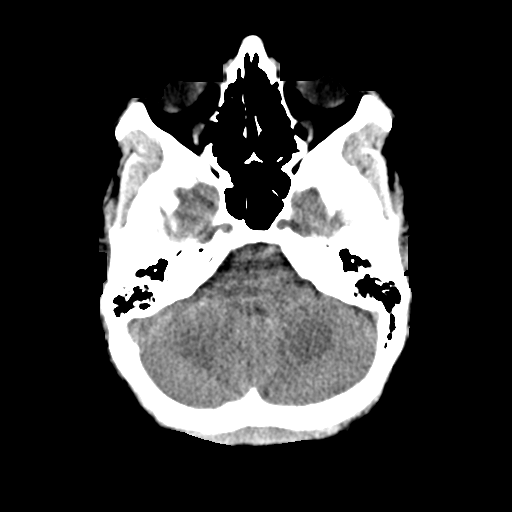
[im 9/30  brain]
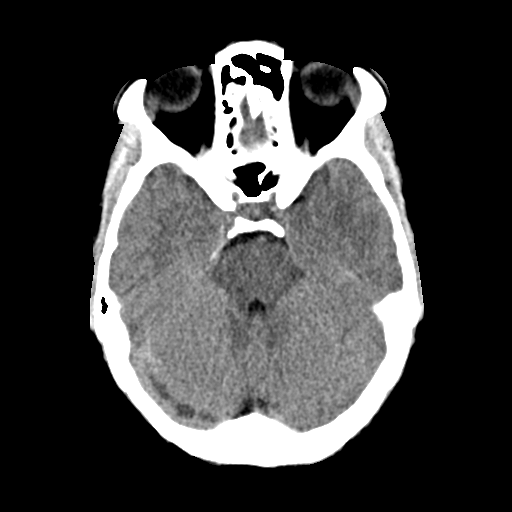
[im 12/30  brain]
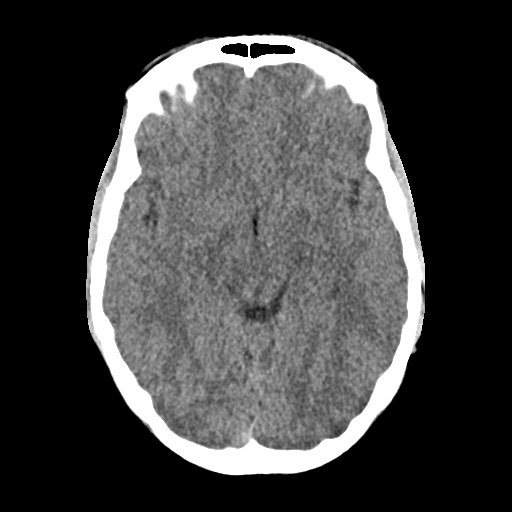
[im 16/30  brain]
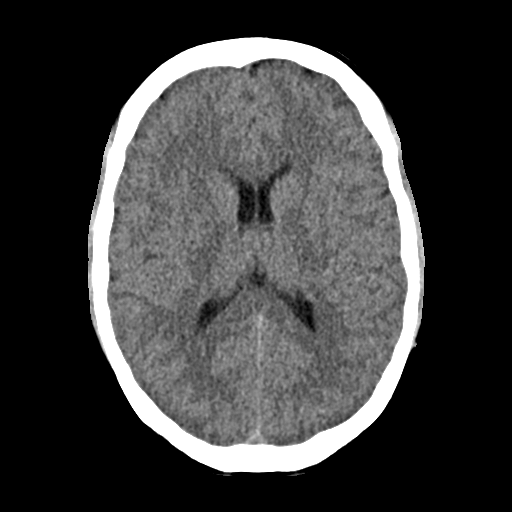
[im 16/30  bone]
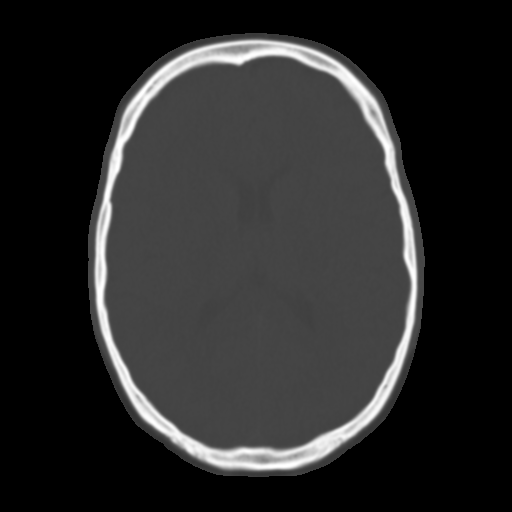
[im 19/30  brain]
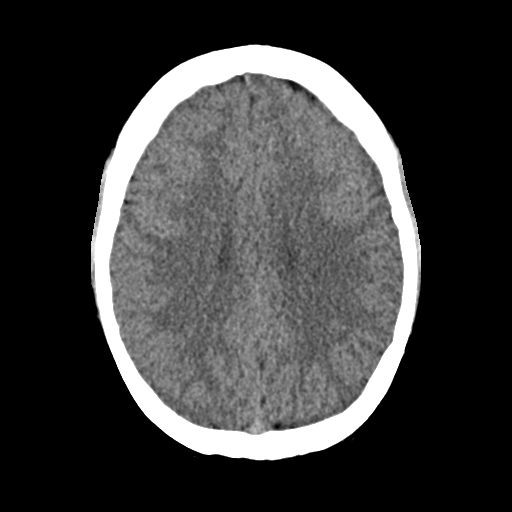
[im 22/30  brain]
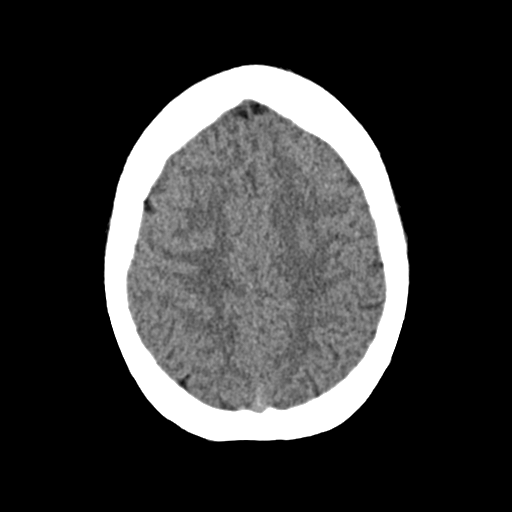
[im 25/30  brain]
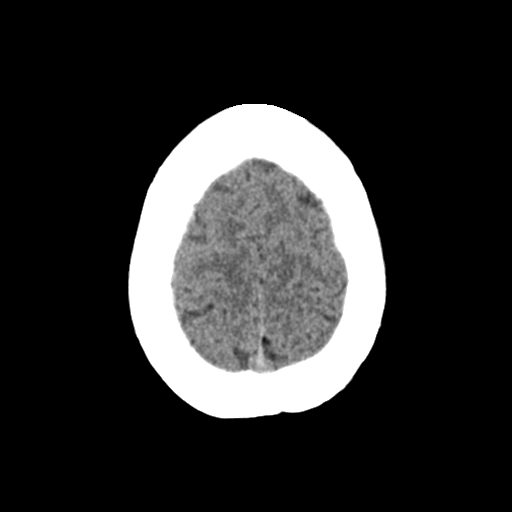
[im 28/30  brain]
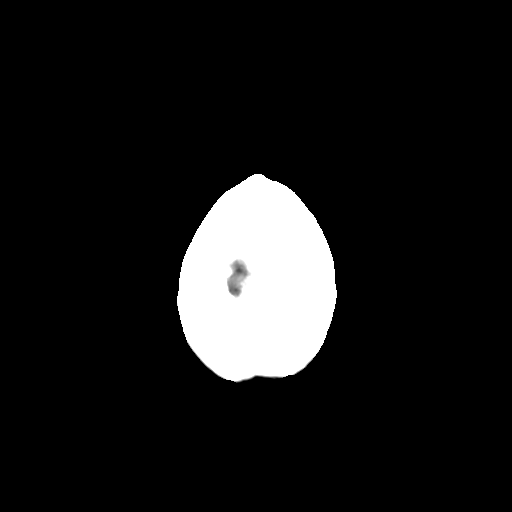
[im 28/30  bone]
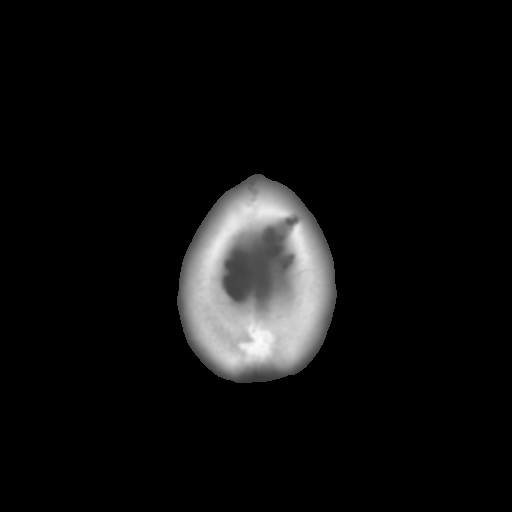

[Series 4: head 3.0 mpr cor · coronal · 0.32mm/px · 3 of 70 slices shown]
[im 24/70  brain]
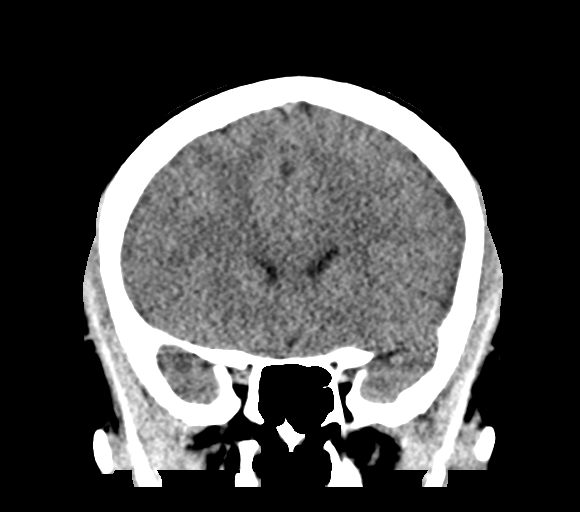
[im 31/70  brain]
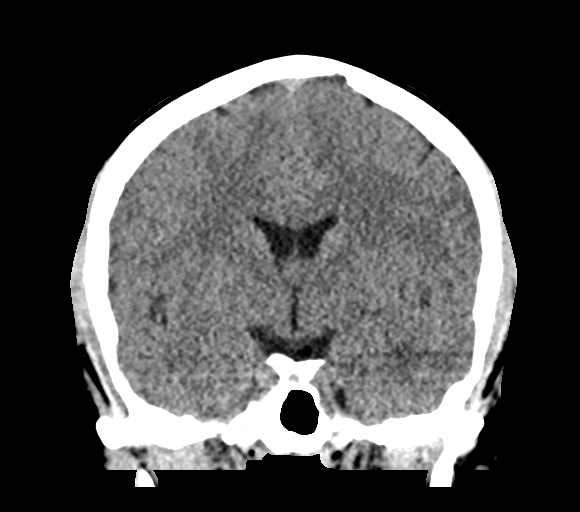
[im 39/70  brain]
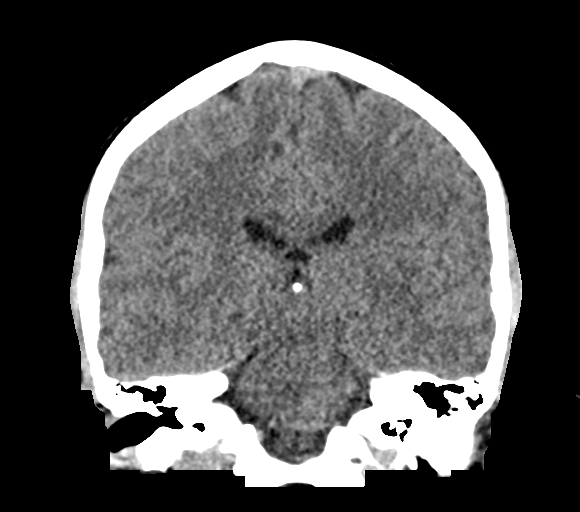

[Series 5: head 3.0 mpr sag · sagittal · 0.29mm/px · 3 of 67 slices shown]
[im 23/67  brain]
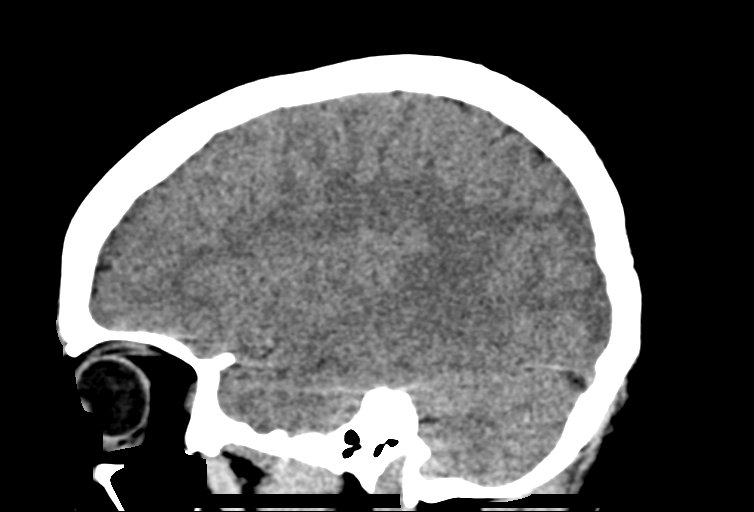
[im 34/67  brain]
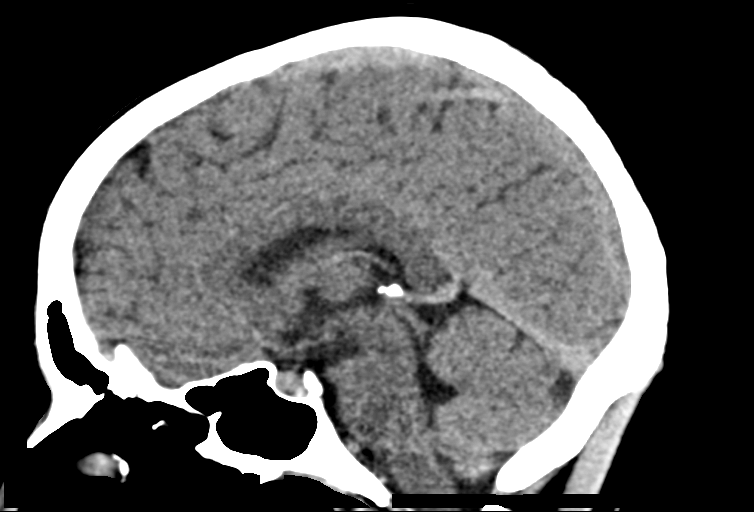
[im 45/67  brain]
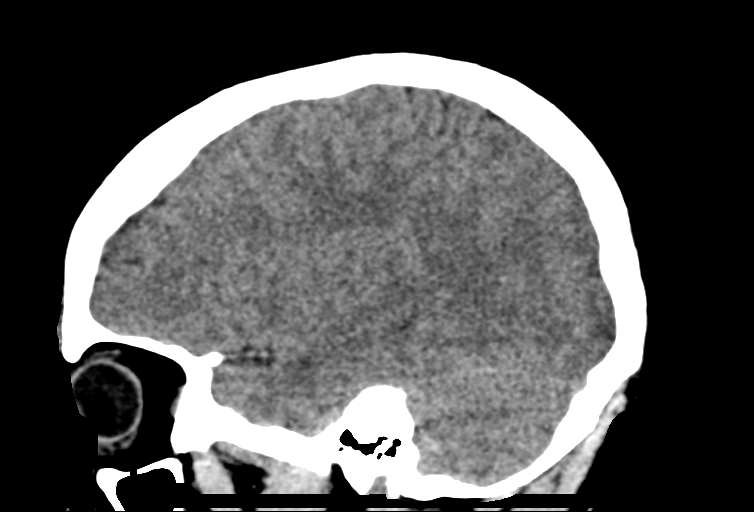

[15 of 47 positions shown; findings below may reference images not displayed]

FINDINGS: Brain: No acute territorial infarction, hemorrhage or intracranial
mass. The ventricles are nonenlarged.

Vascular: No hyperdense vessel or unexpected calcification.

Skull: Normal. Negative for fracture or focal lesion.

Sinuses/Orbits: No acute finding.

Other: None
IMPRESSION: Negative non contrasted CT appearance of the brain
